# Patient Record
Sex: Female | Born: 1965 | Race: Black or African American | Hispanic: No | Marital: Married | Smoking: Never smoker
Health system: Southern US, Community
[De-identification: ages and names within clinical notes are randomized; demographics above are authoritative.]

## PROBLEM LIST (undated history)

## (undated) DIAGNOSIS — I4892 Unspecified atrial flutter: Secondary | ICD-10-CM

## (undated) DIAGNOSIS — I10 Essential (primary) hypertension: Secondary | ICD-10-CM

## (undated) DIAGNOSIS — E669 Obesity, unspecified: Secondary | ICD-10-CM

## (undated) DIAGNOSIS — I4891 Unspecified atrial fibrillation: Principal | ICD-10-CM

---

## 2013-12-16 ENCOUNTER — Emergency Department (HOSPITAL_COMMUNITY): Payer: BC Managed Care – PPO

## 2013-12-16 ENCOUNTER — Observation Stay (HOSPITAL_COMMUNITY)
Admission: EM | Admit: 2013-12-16 | Discharge: 2013-12-17 | Disposition: A | Discharge status: Home / Self Care | Payer: BC Managed Care – PPO | Attending: Cardiology | Admitting: Cardiology

## 2013-12-16 ENCOUNTER — Encounter (HOSPITAL_COMMUNITY): Payer: Self-pay | Admitting: Emergency Medicine

## 2013-12-16 DIAGNOSIS — I4891 Unspecified atrial fibrillation: Secondary | ICD-10-CM | POA: Diagnosis not present

## 2013-12-16 DIAGNOSIS — I480 Paroxysmal atrial fibrillation: Secondary | ICD-10-CM

## 2013-12-16 DIAGNOSIS — Z7901 Long term (current) use of anticoagulants: Secondary | ICD-10-CM | POA: Insufficient documentation

## 2013-12-16 DIAGNOSIS — R0602 Shortness of breath: Secondary | ICD-10-CM | POA: Diagnosis not present

## 2013-12-16 DIAGNOSIS — R609 Edema, unspecified: Secondary | ICD-10-CM | POA: Diagnosis not present

## 2013-12-16 DIAGNOSIS — R7309 Other abnormal glucose: Secondary | ICD-10-CM | POA: Diagnosis not present

## 2013-12-16 DIAGNOSIS — I10 Essential (primary) hypertension: Secondary | ICD-10-CM | POA: Insufficient documentation

## 2013-12-16 DIAGNOSIS — I517 Cardiomegaly: Secondary | ICD-10-CM | POA: Diagnosis not present

## 2013-12-16 DIAGNOSIS — I4892 Unspecified atrial flutter: Secondary | ICD-10-CM | POA: Insufficient documentation

## 2013-12-16 DIAGNOSIS — Z6841 Body Mass Index (BMI) 40.0 and over, adult: Secondary | ICD-10-CM | POA: Diagnosis not present

## 2013-12-16 HISTORY — DX: Obesity, unspecified: E66.9

## 2013-12-16 HISTORY — DX: Unspecified atrial fibrillation: I48.91

## 2013-12-16 HISTORY — DX: Unspecified atrial flutter: I48.92

## 2013-12-16 HISTORY — DX: Essential (primary) hypertension: I10

## 2013-12-16 LAB — PROTIME-INR
INR: 1.4 (ref 0.00–1.49)
INR: 1.29 (ref 0.00–1.49)
PROTHROMBIN TIME: 17.2 s — AB (ref 11.6–15.2)
PROTHROMBIN TIME: 16.1 s — AB (ref 11.6–15.2)

## 2013-12-16 LAB — URINALYSIS, ROUTINE W REFLEX MICROSCOPIC
Bilirubin Urine: NEGATIVE
GLUCOSE, UA: NEGATIVE mg/dL
Hgb urine dipstick: NEGATIVE
Ketones, ur: 15 mg/dL — AB
LEUKOCYTES UA: NEGATIVE
Nitrite: NEGATIVE
PROTEIN: NEGATIVE mg/dL
SPECIFIC GRAVITY, URINE: 1.008 (ref 1.005–1.030)
Urobilinogen, UA: 1 mg/dL (ref 0.0–1.0)
pH: 6 (ref 5.0–8.0)

## 2013-12-16 LAB — BASIC METABOLIC PANEL
ANION GAP: 12 (ref 5–15)
BUN: 11 mg/dL (ref 6–23)
CHLORIDE: 105 meq/L (ref 96–112)
CO2: 26 mEq/L (ref 19–32)
Calcium: 9.2 mg/dL (ref 8.4–10.5)
Creatinine, Ser: 0.66 mg/dL (ref 0.50–1.10)
GFR calc non Af Amer: >90 mL/min (ref >90)
Glucose, Bld: 121 mg/dL — ABNORMAL HIGH (ref 70–99)
Potassium: 3.6 mEq/L — ABNORMAL LOW (ref 3.7–5.3)
SODIUM: 143 meq/L (ref 137–147)

## 2013-12-16 LAB — COMPREHENSIVE METABOLIC PANEL
ALT: 15 U/L (ref 0–35)
AST: 15 U/L (ref 0–37)
Albumin: 3.5 g/dL (ref 3.5–5.2)
Alkaline Phosphatase: 79 U/L (ref 39–117)
Anion gap: 14 (ref 5–15)
BUN: 14 mg/dL (ref 6–23)
CALCIUM: 9.4 mg/dL (ref 8.4–10.5)
CO2: 24 meq/L (ref 19–32)
Chloride: 103 mEq/L (ref 96–112)
Creatinine, Ser: 0.7 mg/dL (ref 0.50–1.10)
Glucose, Bld: 150 mg/dL — ABNORMAL HIGH (ref 70–99)
Potassium: 4.3 mEq/L (ref 3.7–5.3)
Sodium: 141 mEq/L (ref 137–147)
Total Bilirubin: 0.3 mg/dL (ref 0.3–1.2)
Total Protein: 7.7 g/dL (ref 6.0–8.3)

## 2013-12-16 LAB — CBC WITH DIFFERENTIAL/PLATELET
BASOS PCT: 0 % (ref 0–1)
Basophils Absolute: 0 10*3/uL (ref 0.0–0.1)
EOS ABS: 0.1 10*3/uL (ref 0.0–0.7)
Eosinophils Relative: 1 % (ref 0–5)
HEMATOCRIT: 38 % (ref 36.0–46.0)
HEMOGLOBIN: 12.5 g/dL (ref 12.0–15.0)
Lymphocytes Relative: 26 % (ref 12–46)
Lymphs Abs: 2.3 10*3/uL (ref 0.7–4.0)
MCH: 28.9 pg (ref 26.0–34.0)
MCHC: 32.9 g/dL (ref 30.0–36.0)
MCV: 88 fL (ref 78.0–100.0)
MONOS PCT: 6 % (ref 3–12)
Monocytes Absolute: 0.6 10*3/uL (ref 0.1–1.0)
Neutro Abs: 5.8 10*3/uL (ref 1.7–7.7)
Neutrophils Relative %: 67 % (ref 43–77)
Platelets: 265 10*3/uL (ref 150–400)
RBC: 4.32 MIL/uL (ref 3.87–5.11)
RDW: 14.9 % (ref 11.5–15.5)
WBC: 8.7 10*3/uL (ref 4.0–10.5)

## 2013-12-16 LAB — MAGNESIUM: Magnesium: 1.8 mg/dL (ref 1.5–2.5)

## 2013-12-16 LAB — APTT: APTT: 43 s — AB (ref 24–37)

## 2013-12-16 LAB — TROPONIN I: Troponin I: <0.3 ng/mL (ref <0.30)

## 2013-12-16 LAB — PRO B NATRIURETIC PEPTIDE: Pro B Natriuretic peptide (BNP): 34.9 pg/mL (ref 0–125)

## 2013-12-16 LAB — TSH: TSH: 0.88 u[IU]/mL (ref 0.350–4.500)

## 2013-12-16 MED ORDER — SOTALOL HCL (AF) 120 MG PO TABS: 120.0000 mg | ORAL_TABLET | Freq: Two times a day (BID) | ORAL | Status: DC

## 2013-12-16 MED ORDER — DABIGATRAN ETEXILATE MESYLATE 150 MG PO CAPS: 150.0000 mg | ORAL_CAPSULE | Freq: Two times a day (BID) | ORAL | Status: DC

## 2013-12-16 MED ORDER — SODIUM CHLORIDE 0.9 % IJ SOLN
3.0000 mL | Freq: Two times a day (BID) | INTRAMUSCULAR | Status: DC
  Administered 2013-12-17: 3 mL via INTRAVENOUS

## 2013-12-16 MED ORDER — SOTALOL HCL 120 MG PO TABS
120.0000 mg | ORAL_TABLET | Freq: Two times a day (BID) | ORAL | Status: DC
  Administered 2013-12-16 – 2013-12-17 (×2): 120 mg via ORAL
  Filled 2013-12-16 (×3): qty 1

## 2013-12-16 MED ORDER — DABIGATRAN ETEXILATE MESYLATE 150 MG PO CAPS
150.0000 mg | ORAL_CAPSULE | Freq: Two times a day (BID) | ORAL | Status: DC
  Administered 2013-12-16 – 2013-12-17 (×2): 150 mg via ORAL
  Filled 2013-12-16 (×3): qty 1

## 2013-12-16 MED ORDER — TORSEMIDE 20 MG PO TABS
20.0000 mg | ORAL_TABLET | Freq: Every day | ORAL | Status: DC
  Filled 2013-12-16: qty 1

## 2013-12-16 MED ORDER — IRBESARTAN 150 MG PO TABS
150.0000 mg | ORAL_TABLET | Freq: Every day | ORAL | Status: DC
  Administered 2013-12-16 – 2013-12-17 (×2): 150 mg via ORAL
  Filled 2013-12-16 (×2): qty 1

## 2013-12-16 MED ORDER — SODIUM CHLORIDE 0.9 % IJ SOLN: 3.0000 mL | INTRAMUSCULAR | Status: DC | PRN

## 2013-12-16 MED ORDER — DILTIAZEM HCL 25 MG/5ML IV SOLN
10.0000 mg | Freq: Once | INTRAVENOUS | Status: AC
  Administered 2013-12-16: 10 mg via INTRAVENOUS
  Filled 2013-12-16: qty 5

## 2013-12-16 MED ORDER — POTASSIUM CHLORIDE CRYS ER 20 MEQ PO TBCR
20.0000 meq | EXTENDED_RELEASE_TABLET | Freq: Every day | ORAL | Status: DC
  Administered 2013-12-16 – 2013-12-17 (×2): 20 meq via ORAL
  Filled 2013-12-16 (×2): qty 1

## 2013-12-16 MED ORDER — SODIUM CHLORIDE 0.9 % IV SOLN: 250.0000 mL | INTRAVENOUS | Status: DC

## 2013-12-16 MED ORDER — DEXTROSE 5 % IV SOLN
5.0000 mg/h | INTRAVENOUS | Status: DC
  Administered 2013-12-16 – 2013-12-17 (×4): 15 mg/h via INTRAVENOUS
  Filled 2013-12-16 (×4): qty 100

## 2013-12-16 MED ORDER — DILTIAZEM HCL 100 MG IV SOLR
5.0000 mg/h | Freq: Once | INTRAVENOUS | Status: AC
  Administered 2013-12-16: 5 mg/h via INTRAVENOUS

## 2013-12-16 MED ORDER — SODIUM CHLORIDE 0.45 % IV SOLN: INTRAVENOUS | Status: DC

## 2013-12-16 NOTE — ED Notes (Signed)
Pt. Stated, I started having irregular heartbeat this morning around 815 .  I got up and then my rhythm changed

## 2013-12-16 NOTE — H&P (Signed)
Patient ID: Jacqueline Russell MRN: [id number], DOB/AGE: 02/06/1966   Admit date: 12/16/2013   Primary Physician: Pcp Not In System Primary Cardiologist: Physician in [facility] [place]  Pt. Profile:  This is a [name] who presents with recurrent atrial fibrillation.   Problem List  Past Medical History  Diagnosis Date  . Atrial fibrillation   . Atrial flutter   . Obesity     History reviewed. No pertinent past surgical history.   Allergies  No Known Allergies  HPI  [name]  is in [place] visiting relatives.  This morning as she got up to go to the bathroom she developed rapid atrial fibrillation.  She has a history of recurrent paroxysmal atrial fibrillation usually requiring electrical cardioversion.  She began having atrial fibrillation in age 48.  She estimates that she has had 8 previous cardioversions.  She is on long-term Pradaxa and has not missed any doses.  She is also on sotalol AF and has not missed any doses.  She has not had any increased shortness of breath.  She has had peripheral edema.  Her pro BNP is normal.  Her chest x-ray shows cardiomegaly on a portable film.  Her EKG shows atrial fibrillation with a rapid ventricular response.  Her troponin is normal. She has morbid obesity and currently weighs about 545 pounds. 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  Home Medications  Prior to Admission medications   Medication Sig Start Date End Date Taking? Authorizing Provider  dabigatran (PRADAXA) 150 MG CAPS capsule Take 150 mg by mouth 2 (two) times daily.   Yes Historical Provider, MD  medroxyPROGESTERone (DEPO-PROVERA) 150 MG/ML injection Inject 150 mg into the muscle every 3 (three) months.  10/26/13  Yes Historical Provider, MD  potassium chloride SA (K-DUR,KLOR-CON) 20 MEQ tablet Take 20 mEq by mouth daily.   Yes  Historical Provider, MD  SOTALOL AF 120 MG TABS Take 120 mg by mouth 2 (two) times daily. 11/22/13  Yes Historical Provider, MD  torsemide (DEMADEX) 20 MG tablet Take 20 mg by mouth daily.   Yes Historical Provider, MD  valsartan (DIOVAN) 160 MG tablet Take 160 mg by mouth daily.   Yes Historical Provider, MD    Family History  Negative for atrial fibrillation  Social History  History   Social History  . Marital Status: Married    Spouse Name: N/A    Number of Children: N/A  . Years of Education: N/A   Occupational History  . Not on file.   Social History Main Topics  . Smoking status: Never Smoker   . Smokeless tobacco: Not on file  . Alcohol Use: No  . Drug Use: No  . Sexual Activity: Not on file   Other Topics Concern  . Not on file   Social History Narrative  . No narrative on file     Review of Systems General:  No chills, fever, night sweats or weight changes.  Cardiovascular:  No chest pain, dyspnea on exertion, edema, orthopnea, palpitations, paroxysmal nocturnal dyspnea. Dermatological: No rash, lesions/masses Respiratory: No cough, dyspnea Urologic: No hematuria, dysuria Abdominal:   No nausea, vomiting, diarrhea, bright red blood per rectum, melena, or hematemesis Neurologic:  No visual changes, wkns, changes in mental status. All other systems reviewed and are otherwise negative except as noted above.  Physical Exam  Blood pressure 121/79, pulse 104, temperature 97.9  F (36.6 C), resp. rate 14, height 5\' 11"  (1.803 m), weight 545 lb (247.21 kg), SpO2 100.00%.  General: Pleasant, NAD.  She is lying flat in no distress. Psych: Normal affect. Neuro: Alert and oriented X 3. Moves all extremities spontaneously. HEENT: Normal  Neck: Supple without bruits or JVD. Lungs:  Resp regular and unlabored, CTA. Heart: RRR no s3, s4, or murmurs. Abdomen: Soft, non-tender, non-distended, BS + x 4.  Extremities: No clubbing, cyanosis.  There is trace edema.  DP/PT/Radials 2+ and equal bilaterally.  Labs  Troponin [facility] of Care Test) No results found for this basename: TROPIPOC,  in the last 72 hours  Recent Labs  12/16/13 1040  TROPONINI <0.30   Lab Results  Component Value Date   WBC 8.7 12/16/2013   HGB 12.5 12/16/2013   HCT 38.0 12/16/2013   MCV 88.0 12/16/2013   PLT 265 12/16/2013     Recent Labs Lab 12/16/13 1040  NA 141  K 4.3  CL 103  CO2 24  BUN 14  CREATININE 0.70  CALCIUM 9.4  PROT 7.7  BILITOT 0.3  ALKPHOS 79  ALT 15  AST 15  GLUCOSE 150*   No results found for this basename: CHOL,  HDL,  LDLCALC,  TRIG   No results found for this basename: DDIMER     Radiology/Studies  Dg Chest Portable 1 View  12/16/2013   CLINICAL DATA:  ATRIAL FIBRILLATION  EXAM: PORTABLE CHEST - 1 VIEW  COMPARISON:  None.  FINDINGS: Moderate cardiomegaly. Low volumes with central pulmonary vascular congestion, perihilar and bibasilar interstitial opacities. No definite effusion. Regional bones unremarkable.  IMPRESSION: 1. Cardiomegaly with vascular congestion.   Electronically Signed   By: Oley Balm M.D.   On: 12/16/2013 11:36    ECG  Atrial fibrillation with rapid ventricular response.  ASSESSMENT AND PLAN  1. recurrent atrial fibrillation.  Past history of multiple cardioversions. 2. morbid obesity 3. prediabetes by history.  Recent A1c 6.5  Plan: Admit for observation.  Continue IV Cardizem overnight.  Continue sotalol. If she does not convert to normal sinus rhythm overnight, we will hold her breakfast and try to arrange for DCCV tomorrow in her room, after which she likely could be discharged tomorrow afternoon.  Signed, Cassell Clement, MD  12/16/2013, 1:46 PM

## 2013-12-16 NOTE — ED Provider Notes (Signed)
CSN: [id number]     Arrival date & time 12/16/13  4098 History   First MD Initiated Contact with Patient 12/16/13 1011     Chief Complaint  Patient presents with  . Atrial Fibrillation     (Consider location/radiation/quality/duration/timing/severity/associated sxs/prior Treatment) HPI Comments: Patient presents with acute onset of palpitations that onset about 2 hours ago at rest. She denies any chest pain. She feels her shortness of breath is a bit worse than baseline. Is a history of atrial flutter status post multiple ablations. She is visiting from out of town. She lives in [place]. She is morbidly obese and states compliance with her medications. She is taking pradaxa for anticoagulation. she has leg swelling at baseline. Denies any dizziness or lightheadedness. Denies any focal weakness, numbness or tingling. No nausea or vomiting.  The history is provided by the patient and a relative.    Past Medical History  Diagnosis Date  . Atrial fibrillation   . Atrial flutter   . Obesity   . Atrial fibrillation   . Obesity   . Hypertension    History reviewed. No pertinent past surgical history. History reviewed. No pertinent family history. History  Substance Use Topics  . Smoking status: Never Smoker   . Smokeless tobacco: Never Used  . Alcohol Use: No   OB History   Grav Para Term Preterm Abortions TAB SAB Ect Mult Living                 Review of Systems  Constitutional: Positive for activity change and appetite change. Negative for fever.  HENT: Negative for congestion and rhinorrhea.   Eyes: Negative for visual disturbance.  Respiratory: Negative for cough, chest tightness and shortness of breath.   Cardiovascular: Positive for palpitations and leg swelling. Negative for chest pain.  Gastrointestinal: Negative for nausea, vomiting and abdominal pain.  Genitourinary: Negative for dysuria, hematuria, vaginal bleeding and vaginal discharge.  Musculoskeletal:  Negative for arthralgias, back pain and myalgias.  Neurological: Negative for dizziness and headaches.  A complete 10 system review of systems was obtained and all systems are negative except as noted in the HPI and PMH.      Allergies  Review of patient's allergies indicates no known allergies.  Home Medications   Prior to Admission medications   Medication Sig Start Date End Date Taking? Authorizing Provider  dabigatran (PRADAXA) 150 MG CAPS capsule Take 150 mg by mouth 2 (two) times daily.   Yes Historical Provider, MD  potassium chloride SA (K-DUR,KLOR-CON) 20 MEQ tablet Take 20 mEq by mouth daily.   Yes Historical Provider, MD  SOTALOL AF 120 MG TABS Take 120 mg by mouth 2 (two) times daily. 11/22/13  Yes Historical Provider, MD  torsemide (DEMADEX) 20 MG tablet Take 20 mg by mouth daily.   Yes Historical Provider, MD  valsartan (DIOVAN) 160 MG tablet Take 160 mg by mouth daily.   Yes Historical Provider, MD  medroxyPROGESTERone (DEPO-PROVERA) 150 MG/ML injection Inject 150 mg into the muscle every 3 (three) months.  10/26/13   Historical Provider, MD   BP 126/78  Pulse 106  Temp(Src) 97.6 F (36.4 C) (Oral)  Resp 18  Ht 5\' 11"  (1.803 m)  Wt 545 lb (247.21 kg)  BMI 76.05 kg/m2  SpO2 100% Physical Exam  Nursing note and vitals reviewed. Constitutional: She is oriented to person, place, and time. She appears well-developed and well-nourished. No distress.  Morbidly obese  HENT:  Head: Normocephalic and atraumatic.  Mouth/Throat: Oropharynx  is clear and moist. No oropharyngeal exudate.  Eyes: Conjunctivae and EOM are normal. Pupils are equal, round, and reactive to light.  Neck: Normal range of motion. Neck supple.  No meningismus.  Cardiovascular: Normal rate, normal heart sounds and intact distal pulses.   No murmur heard. Irregular rhythm, rate 170s.  Pulmonary/Chest: Effort normal and breath sounds normal. No respiratory distress.  Abdominal: Soft. There is no  tenderness. There is no rebound and no guarding.  Musculoskeletal: Normal range of motion. She exhibits edema. She exhibits no tenderness.  Neurological: She is alert and oriented to person, place, and time. No cranial nerve deficit. She exhibits normal muscle tone. Coordination normal.  No ataxia on finger to nose bilaterally. No pronator drift. 5/5 strength throughout. CN 2-12 intact. Negative Romberg. Equal grip strength. Sensation intact. Gait is normal.   Skin: Skin is warm.  Psychiatric: She has a normal mood and affect. Her behavior is normal.    ED Course  Procedures (including critical care time) Labs Review Labs Reviewed  COMPREHENSIVE METABOLIC PANEL - Abnormal; Notable for the following:    Glucose, Bld 150 (*)    All other components within normal limits  PROTIME-INR - Abnormal; Notable for the following:    Prothrombin Time 17.2 (*)    All other components within normal limits  URINALYSIS, ROUTINE W REFLEX MICROSCOPIC - Abnormal; Notable for the following:    Ketones, ur 15 (*)    All other components within normal limits  CBC WITH DIFFERENTIAL  TROPONIN I  PRO B NATRIURETIC PEPTIDE  BASIC METABOLIC PANEL  PROTIME-INR  APTT  MAGNESIUM  TSH  T4, FREE  BASIC METABOLIC PANEL    Imaging Review Dg Chest Portable 1 View  12/16/2013   CLINICAL DATA:  ATRIAL FIBRILLATION  EXAM: PORTABLE CHEST - 1 VIEW  COMPARISON:  None.  FINDINGS: Moderate cardiomegaly. Low volumes with central pulmonary vascular congestion, perihilar and bibasilar interstitial opacities. No definite effusion. Regional bones unremarkable.  IMPRESSION: 1. Cardiomegaly with vascular congestion.   Electronically Signed   By: Oley Balm M.D.   On: 12/16/2013 11:36     EKG Interpretation   Date/Time:  Saturday December 16 2013 09:49:44 EDT Ventricular Rate:  161 PR Interval:    QRS Duration: 84 QT Interval:  312 QTC Calculation: 510 R Axis:   37 Text Interpretation:  Atrial fibrillation with rapid  ventricular response  Nonspecific ST and T wave abnormality Abnormal ECG No previous ECGs  available Confirmed by Frona Yost  MD, Nettie Cromwell ([id number]) on 12/16/2013 10:15:44  AM      MDM   Final diagnoses:  Paroxysmal atrial fibrillation   Atrial fibrillation with RVR.  Mental status and BP stable.  No chest pain. HR 180s.  IV cardizem gtt started. Troponin negative. Patient compliant with pradaxa and denies any missed doses.  HR improved to 90-100s on cardizem.  Still in A fib. Dw cardiology who will admit.  CRITICAL CARE Performed by: Glynn Octave Total critical care time: 30 Critical care time was exclusive of separately billable procedures and treating other patients. Critical care was necessary to treat or prevent imminent or life-threatening deterioration. Critical care was time spent personally by me on the following activities: development of treatment plan with patient and/or surrogate as well as nursing, discussions with consultants, evaluation of patient's response to treatment, examination of patient, obtaining history from patient or surrogate, ordering and performing treatments and interventions, ordering and review of laboratory studies, ordering and review of radiographic studies, pulse oximetry  and re-evaluation of patient's condition.    Glynn OctaveStephen Eliab Closson, MD 12/16/13 1730

## 2013-12-17 ENCOUNTER — Encounter (HOSPITAL_COMMUNITY): Payer: Self-pay | Admitting: Anesthesiology

## 2013-12-17 ENCOUNTER — Encounter (HOSPITAL_COMMUNITY)
Admission: EM | Disposition: A | Discharge status: Home / Self Care | Payer: Self-pay | Source: Home / Self Care | Attending: Emergency Medicine

## 2013-12-17 ENCOUNTER — Encounter (HOSPITAL_COMMUNITY): Payer: BC Managed Care – PPO | Admitting: Anesthesiology

## 2013-12-17 ENCOUNTER — Observation Stay (HOSPITAL_COMMUNITY): Payer: BC Managed Care – PPO | Admitting: Anesthesiology

## 2013-12-17 DIAGNOSIS — Z7901 Long term (current) use of anticoagulants: Secondary | ICD-10-CM | POA: Diagnosis not present

## 2013-12-17 DIAGNOSIS — I4891 Unspecified atrial fibrillation: Secondary | ICD-10-CM | POA: Diagnosis not present

## 2013-12-17 DIAGNOSIS — I517 Cardiomegaly: Secondary | ICD-10-CM | POA: Diagnosis not present

## 2013-12-17 HISTORY — PX: CARDIOVERSION: SHX1299

## 2013-12-17 LAB — BASIC METABOLIC PANEL
Anion gap: 12 (ref 5–15)
BUN: 10 mg/dL (ref 6–23)
CO2: 23 meq/L (ref 19–32)
Calcium: 8.9 mg/dL (ref 8.4–10.5)
Chloride: 106 mEq/L (ref 96–112)
Creatinine, Ser: 0.6 mg/dL (ref 0.50–1.10)
GFR calc Af Amer: >90 mL/min (ref >90)
GLUCOSE: 132 mg/dL — AB (ref 70–99)
POTASSIUM: 3.8 meq/L (ref 3.7–5.3)
Sodium: 141 mEq/L (ref 137–147)

## 2013-12-17 LAB — T4, FREE: Free T4: 1.22 ng/dL (ref 0.80–1.80)

## 2013-12-17 SURGERY — CARDIOVERSION
Anesthesia: General

## 2013-12-17 MED ORDER — ONDANSETRON HCL 4 MG/2ML IJ SOLN: 4.0000 mg | Freq: Once | INTRAMUSCULAR | Status: DC | PRN

## 2013-12-17 MED ORDER — OXYCODONE HCL 5 MG PO TABS: 5.0000 mg | ORAL_TABLET | Freq: Once | ORAL | Status: DC | PRN

## 2013-12-17 MED ORDER — OXYCODONE HCL 5 MG/5ML PO SOLN: 5.0000 mg | Freq: Once | ORAL | Status: DC | PRN

## 2013-12-17 MED ORDER — HYDROMORPHONE HCL PF 1 MG/ML IJ SOLN: 0.2500 mg | INTRAMUSCULAR | Status: DC | PRN

## 2013-12-17 MED ORDER — PROPOFOL 10 MG/ML IV BOLUS
INTRAVENOUS | Status: DC | PRN
  Administered 2013-12-17 (×2): 50 mg via INTRAVENOUS
  Administered 2013-12-17: 100 mg via INTRAVENOUS

## 2013-12-17 MED ORDER — MEPERIDINE HCL 25 MG/ML IJ SOLN: 6.2500 mg | INTRAMUSCULAR | Status: DC | PRN

## 2013-12-17 MED ORDER — LIDOCAINE HCL (CARDIAC) 20 MG/ML IV SOLN
INTRAVENOUS | Status: DC | PRN
  Administered 2013-12-17: 100 mg via INTRAVENOUS

## 2013-12-17 MED ORDER — SODIUM CHLORIDE 0.9 % IV SOLN
INTRAVENOUS | Status: DC | PRN
  Administered 2013-12-17: 15:00:00 via INTRAVENOUS

## 2013-12-17 NOTE — Anesthesia Preprocedure Evaluation (Addendum)
Anesthesia Evaluation  Patient identified by MRN, date of birth, ID band Patient awake    Reviewed: Allergy & Precautions, H&P , NPO status , Patient's Chart, lab work & pertinent test results  Airway Mallampati: II TM Distance: >3 FB Neck ROM: Full    Dental  (+) Teeth Intact, Dental Advisory Given   Pulmonary          Cardiovascular hypertension, Pt. on medications + dysrhythmias Atrial Fibrillation     Neuro/Psych    GI/Hepatic   Endo/Other    Renal/GU      Musculoskeletal   Abdominal   Peds  Hematology   Anesthesia Other Findings   Reproductive/Obstetrics                       Anesthesia Physical Anesthesia Plan  ASA: III  Anesthesia Plan: General   Post-op Pain Management:    Induction: Intravenous  Airway Management Planned: Mask  Additional Equipment:   Intra-op Plan:   Post-operative Plan:   Informed Consent: I have reviewed the patients History and Physical, chart, labs and discussed the procedure including the risks, benefits and alternatives for the proposed anesthesia with the patient or authorized representative who has indicated his/her understanding and acceptance.   Dental advisory given  Plan Discussed with: CRNA and Surgeon  Anesthesia Plan Comments: (BMI 76.05 - to be done in PACU)      Anesthesia Quick Evaluation

## 2013-12-17 NOTE — Discharge Summary (Signed)
Physician Discharge Summary  Patient ID: Jacqueline Russell MRN: [id number] DOB/AGE: [birth year] 48 y.o.  Admit date: 12/16/2013 Discharge date: 12/17/2013  Admission Diagnoses: Atrial fibrillation with RVR Discharge Diagnoses:  Active Problems:   Atrial fibrillation   Morbid obesity   Discharged Condition: stable  Patient Profile: This is a morbidly obese woman from [facility] [place] who presents with recurrent atrial fibrillation.    Hospital Course: Jacqueline Russell is a 48 year old, morbidly obese female, weighing about 545 pounds, from [facility] [place], who has been in [place] visiting relatives. On the morning of 12/16/2013, she developed rapid atrial fibrillation. She states she has a history of recurrent paroxysmal atrial fibrillation, usually requiring electrocardioversion. She began having atrial fibrillation at the age of 48. She estimates that she has had 8 previous cardioversions. She is on long-term Pradaxa and denied missing any doses. She is also on sotalol AF and has not missed any doses. She denied any increased shortness of breath, but did note peripheral edema. Her pro BNP on admission was normal. Her chest x-ray demonstrated cardiomegaly but was otherwise unremarkable. Her EKGs demonstrated atrial fibrillation with rapid ventricular response without ischemic abnormalities. Initial troponin was negative. She was admitted for observation. She was admitted to telemetry and placed on IV Cardizem for rate control. She was continued on sotalol. Cardiac enzymes were cycled overnight and were negative x 3. She did not successfully spontaneously convert to normal sinus rhythm through rate control strategy. Subsequently, she underwent a direct current cardioversion. The procedure was performed by Dr. Patty SermonsBrackbill. She was successfully converted back into normal sinus rhythm after delivery of one shock at 120J. Postprocedural EKG demonstrated normal sinus rhythm. She  tolerated the procedure well. She no complications during post procedure recovery. She was last seen and evaluated by Dr. Patty SermonsBrackbill who determined that she was stable for discharge home. She was continued on all of her home meds. She was instructed to followup with her primary cardiologist in [place].  Consults: None  Significant Diagnostic Studies: none  Treatments:  See Hospital Course  Discharge Exam: Blood pressure 110/68, pulse 110, temperature 99.6 F (37.6 C), temperature source Oral, resp. rate 18, height 5\' 11"  (1.803 m), weight 545 lb (247.21 kg), SpO2 100.00%.   Disposition: Final discharge disposition not confirmed      Discharge Instructions   Diet - low sodium heart healthy    Complete by:  As directed      Increase activity slowly    Complete by:  As directed             Medication List         dabigatran 150 MG Caps capsule  Commonly known as:  PRADAXA  Take 150 mg by mouth 2 (two) times daily.     medroxyPROGESTERone 150 MG/ML injection  Commonly known as:  DEPO-PROVERA  Inject 150 mg into the muscle every 3 (three) months.     potassium chloride SA 20 MEQ tablet  Commonly known as:  K-DUR,KLOR-CON  Take 20 mEq by mouth daily.     SOTALOL AF 120 MG Tabs  Take 120 mg by mouth 2 (two) times daily.     torsemide 20 MG tablet  Commonly known as:  DEMADEX  Take 20 mg by mouth daily.     valsartan 160 MG tablet  Commonly known as:  DIOVAN  Take 160 mg by mouth daily.       Follow-up Information   Please follow up. (You will need to follow-up  with your primary cardiologist in [facility])      TIME SPENT ON DISCHARGE, INCLUDING PHYSICIAN TIME: >30 MINUTES  Signed: Robbie Lis 12/17/2013, 4:35 PM

## 2013-12-17 NOTE — Progress Notes (Signed)
Patient Name: Jacqueline Russell Date of Encounter: 12/17/2013     Active Problems:   Atrial fibrillation    SUBJECTIVE  Patient remains in atrial flutter/fibrillation this am. Rate controlled on IV cardizem. NPO for DCCV in PACU this am.  CURRENT MEDS . dabigatran  150 mg Oral BID  . irbesartan  150 mg Oral Daily  . potassium chloride SA  20 mEq Oral Daily  . sodium chloride  3 mL Intravenous Q12H  . sotalol  120 mg Oral Q12H  . torsemide  20 mg Oral Daily    OBJECTIVE  Filed Vitals:   12/16/13 1500 12/16/13 1550 12/16/13 2000 12/17/13 0453  BP: 119/62 126/78 135/75 137/77  Pulse: 81 106 70 108  Temp:  97.6 F (36.4 C) 98.1 F (36.7 C) 98.1 F (36.7 C)  TempSrc:  Oral Oral Oral  Resp: 20 18 20 18   Height:      Weight:      SpO2: 100% 100% 99% 99%    Intake/Output Summary (Last 24 hours) at 12/17/13 1106 Last data filed at 12/17/13 0900  Gross per 24 hour  Intake 518.25 ml  Output      0 ml  Net 518.25 ml   Filed Weights   12/16/13 0953 12/16/13 1002  Weight: 545 lb (247.21 kg) 545 lb (247.21 kg)    PHYSICAL EXAM  General: Pleasant, NAD. She is lying flat in no distress.  Psych: Normal affect.  Neuro: Alert and oriented X 3. Moves all extremities spontaneously.  HEENT: Normal  Neck: Supple without bruits or JVD.  Lungs: Resp regular and unlabored, CTA.  Heart: Irregular pulse. no s3, s4, or murmurs.  Abdomen: Soft, non-tender, non-distended, BS + x 4.  Extremities: No clubbing, cyanosis. There is trace edema. DP/PT/Radials 2+ and equal bilaterally.   Accessory Clinical Findings  CBC  Recent Labs  12/16/13 1040  WBC 8.7  NEUTROABS 5.8  HGB 12.5  HCT 38.0  MCV 88.0  PLT 265   Basic Metabolic Panel  Recent Labs  12/16/13 1040 12/16/13 1845 12/17/13 0540  NA 141 143 141  K 4.3 3.6* 3.8  CL 103 105 106  CO2 24 26 23   GLUCOSE 150* 121* 132*  BUN 14 11 10   CREATININE 0.70 0.66 0.60  CALCIUM 9.4 9.2 8.9  MG  --  1.8  --     Liver Function Tests  Recent Labs  12/16/13 1040  AST 15  ALT 15  ALKPHOS 79  BILITOT 0.3  PROT 7.7  ALBUMIN 3.5   No results found for this basename: LIPASE, AMYLASE,  in the last 72 hours Cardiac Enzymes  Recent Labs  12/16/13 1040  TROPONINI <0.30   BNP No components found with this basename: POCBNP,  D-Dimer No results found for this basename: DDIMER,  in the last 72 hours Hemoglobin A1C No results found for this basename: HGBA1C,  in the last 72 hours Fasting Lipid Panel No results found for this basename: CHOL, HDL, LDLCALC, TRIG, CHOLHDL, LDLDIRECT,  in the last 72 hours Thyroid Function Tests  Recent Labs  12/16/13 1845  TSH 0.880    TELE  Atrial flutter/fib at 110/min  ECG    Radiology/Studies  Dg Chest Portable 1 View  12/16/2013   CLINICAL DATA:  ATRIAL FIBRILLATION  EXAM: PORTABLE CHEST - 1 VIEW  COMPARISON:  None.  FINDINGS: Moderate cardiomegaly. Low volumes with central pulmonary vascular congestion, perihilar and bibasilar interstitial opacities. No definite effusion. Regional bones unremarkable.  IMPRESSION:  1. Cardiomegaly with vascular congestion.   Electronically Signed   By: Oley Balmaniel  Hassell M.D.   On: 12/16/2013 11:36    ASSESSMENT AND PLAN  1. recurrent atrial fibrillation. Past history of multiple cardioversions.  2. morbid obesity  3. prediabetes by history. Recent A1c 6.5   Plan: DCCV this am in PACU. Discharge later this afternoon if DCCV is successful and she will follow [facility].   Signed, Cassell Clementhomas Carole Deere MD

## 2013-12-17 NOTE — CV Procedure (Signed)
Electrical Cardioversion Procedure Note Jacqueline Russell [id number] 1965-06-22  Procedure: Electrical Cardioversion Indications:  Atrial Fibrillation  Procedure Details Consent: Risks of procedure as well as the alternatives and risks of each were explained to the (patient/caregiver).  Consent for procedure obtained. Time Out: Verified patient identification, verified procedure, site/side was marked, verified correct patient position, special equipment/implants available, medications/allergies/relevent history reviewed, required imaging and test results available.  Performed  Patient placed on cardiac monitor, pulse oximetry, supplemental oxygen as necessary.  Sedation given: propofol 200 mg Pacer pads placed anterior and posterior chest.  Cardioverted 1 time(s).  Cardioverted at 120J.  Evaluation Findings: Post procedure EKG shows: NSR Complications: None Patient did tolerate procedure well.   Jacqueline Russell 12/17/2013, 3:07 PM

## 2013-12-17 NOTE — Anesthesia Postprocedure Evaluation (Signed)
  Anesthesia Post-op Note  Patient: Jacqueline Russell  Procedure(s) Performed: Procedure(s): CARDIOVERSION (N/A)  Patient Location: PACU  Anesthesia Type:MAC  Level of Consciousness: awake, alert  and oriented  Airway and Oxygen Therapy: Patient Spontanous Breathing and Patient connected to nasal cannula oxygen  Post-op Pain: none  Post-op Assessment: Post-op Vital signs reviewed, Patient's Cardiovascular Status Stable and Respiratory Function Stable  Post-op Vital Signs: Reviewed and stable  Last Vitals:  Filed Vitals:   12/17/13 1250  BP: 110/68  Pulse: 110  Temp: 37.6 C  Resp: 18    Complications: No apparent anesthesia complications

## 2013-12-17 NOTE — Progress Notes (Signed)
Utilization review completed.  

## 2013-12-17 NOTE — Progress Notes (Signed)
Underwent successful cardioversion in PACU. Will plan discharge home later today.  Continue all home meds. [facility]

## 2013-12-17 NOTE — Transfer of Care (Signed)
Immediate Anesthesia Transfer of Care Note  Patient: Jacqueline Russell  Procedure(s) Performed: Procedure(s): CARDIOVERSION (N/A)  Patient Location: PACU  Anesthesia Type:MAC  Level of Consciousness: awake, alert  and oriented  Airway & Oxygen Therapy: Patient Spontanous Breathing and Patient connected to nasal cannula oxygen  Post-op Assessment: Report given to PACU RN and Post -op Vital signs reviewed and stable  Post vital signs: Reviewed and stable  Complications: No apparent anesthesia complications

## 2013-12-19 ENCOUNTER — Encounter (HOSPITAL_COMMUNITY): Payer: Self-pay | Admitting: Cardiology

## 2016-03-08 IMAGING — CR DG CHEST 1V PORT
1 series · 1 of 1 positions shown · non-contrast
Comparison: None.

CLINICAL DATA: ATRIAL FIBRILLATION

EXAM:
PORTABLE CHEST - 1 VIEW

[AP]
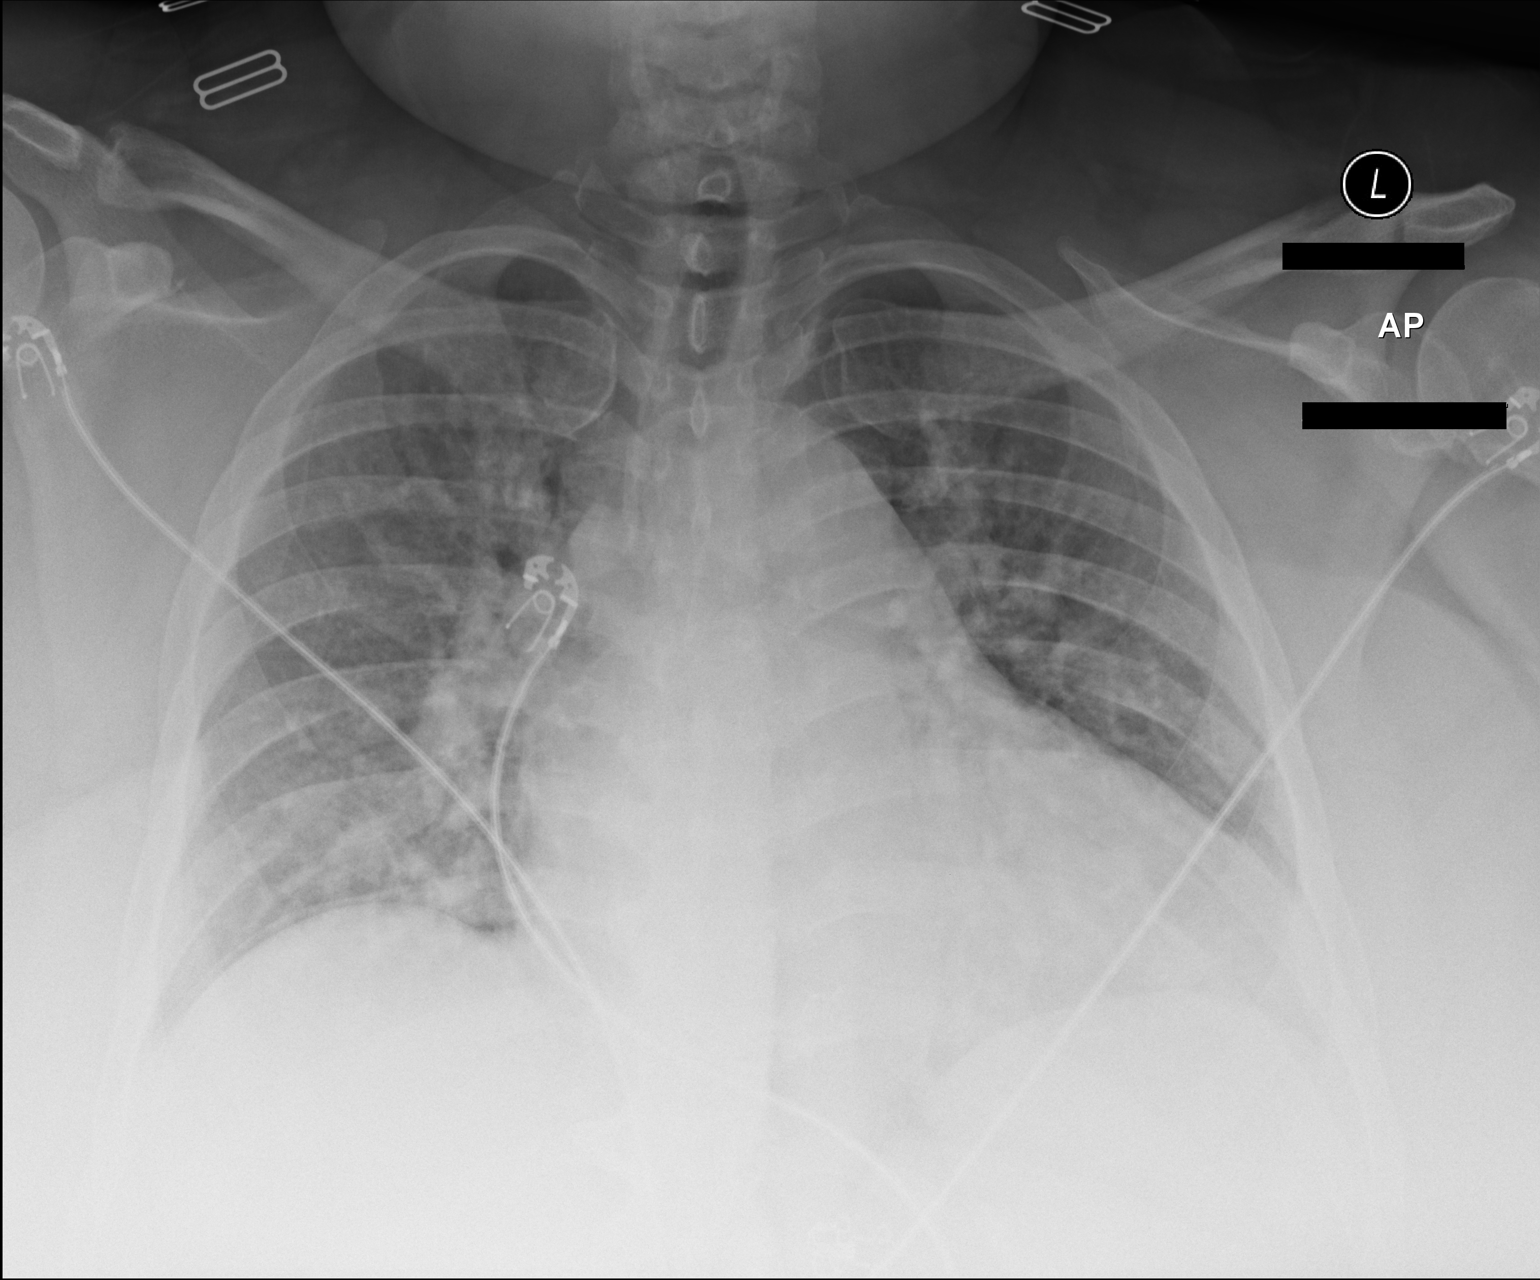

[1 of 1 positions shown; findings below may reference images not displayed]

FINDINGS: Moderate cardiomegaly. Low volumes with central pulmonary vascular
congestion, perihilar and bibasilar interstitial opacities. No
definite effusion. Regional bones unremarkable.
IMPRESSION: 1. Cardiomegaly with vascular congestion.
